# Patient Record
Sex: Female | Born: 2005 | Race: White | Hispanic: No | Marital: Single | Smoking: Never smoker
Health system: Southern US, Community
[De-identification: ages and names within clinical notes are randomized; demographics above are authoritative.]

## PROBLEM LIST (undated history)

## (undated) HISTORY — PX: FOOT FRACTURE SURGERY: SHX645

## (undated) HISTORY — PX: TONSILLECTOMY AND ADENOIDECTOMY: SUR1326

---
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
## 2024-06-18 ENCOUNTER — Ambulatory Visit: Payer: Self-pay

## 2024-06-19 ENCOUNTER — Ambulatory Visit: Admission: EM | Admit: 2024-06-19 | Discharge: 2024-06-19 | Disposition: A | Discharge status: Home / Self Care

## 2024-06-19 ENCOUNTER — Encounter: Payer: Self-pay | Admitting: Emergency Medicine

## 2024-06-19 DIAGNOSIS — R079 Chest pain, unspecified: Secondary | ICD-10-CM

## 2024-06-19 NOTE — ED Triage Notes (Signed)
 Patient complains right sided chest pain that started x 3 days ago. Patient reports that it hurts to take a deep breath. Patent denies SOB.  Patient has not taken anything for symptoms.

## 2024-06-19 NOTE — ED Provider Notes (Signed)
 [facility]    CSN: [id number] Arrival date & time: 06/19/24  1135      History   Chief Complaint Chief Complaint  Patient presents with   Chest Pain    HPI Ashley Rosales is a 18 y.o. female.   Patient presents for evaluation of intermittent right-sided chest pain beginning 3 days ago.  At times can be felt with movement, exacerbated by lying flat, can be felt with deep breathing.  Has not occurred before.  Denies cardiac history or familial history.  Has not attempted treatment.  Endorses to being over the weekend.  Denies shortness of breath, dizziness, lightheadedness, vision changes or nausea or vomiting.  History reviewed. No pertinent past medical history.  Patient Active Problem List   Diagnosis Date Noted   Closed fracture of phalanx of foot 07/29/2022   Sprain of left shoulder 08/11/2020   Abnormal thyroid function test 08/14/2012   Frequency of urination and polyuria 01/11/2012    Past Surgical History:  Procedure Laterality Date   FOOT FRACTURE SURGERY     TONSILLECTOMY AND ADENOIDECTOMY      OB History   No obstetric history on file.      Home Medications    Prior to Admission medications   Medication Sig Start Date End Date Taking? Authorizing Provider  amoxicillin (AMOXIL) 400 MG/5ML suspension TAKE 2 TEASPOONSFUL (10 MLS) BY MOUTH TWICE A DAY FOR 10 DAYS    [provider]  lisdexamfetamine (VYVANSE) 20 MG capsule Take 1 capsule by mouth daily.    [provider]  meloxicam (MOBIC) 7.5 MG tablet TAKE 1 TABLET BY MOUTH    [provider]  Methylphenidate HCl ER (QUILLIVANT XR) 25 MG/5ML SRER GIVE 6ML BY MOUTH EVERY DAY    [provider]  norgestimate-ethinyl estradiol (ORTHO-CYCLEN) 0.25-35 MG-MCG tablet Take 1 tablet by mouth daily. 07/06/22   [provider]    Family History History reviewed. No pertinent family history.  Social History Social History   Tobacco Use   Smoking  status: Never   Smokeless tobacco: Never  Vaping Use   Vaping status: Former  Substance Use Topics   Alcohol use: Yes   Drug use: Never     Allergies   Patient has no known allergies.   Review of Systems Review of Systems   Physical Exam Triage Vital Signs ED Triage Vitals [06/19/24 1144]  Encounter Vitals Group     BP 115/80     Girls Systolic BP Percentile      Girls Diastolic BP Percentile      Boys Systolic BP Percentile      Boys Diastolic BP Percentile      Pulse Rate (!) 120     Resp 18     Temp 98.5 F (36.9 C)     Temp Source Oral     SpO2 97 %     Weight      Height      Head Circumference      Peak Flow      Pain Score      Pain Loc      Pain Education      Exclude from Growth Chart    No data found.  Updated Vital Signs BP 115/80 (BP Location: Left Arm)   Pulse (!) 120   Temp 98.5 F (36.9 C) (Oral)   Resp 18   LMP 06/10/2024 (Approximate)   SpO2 97%   Visual Acuity Right Eye Distance:  Left Eye Distance:   Bilateral Distance:    Right Eye Near:   Left Eye Near:    Bilateral Near:     Physical Exam Constitutional:      Appearance: Normal appearance.  Eyes:     Extraocular Movements: Extraocular movements intact.  Cardiovascular:     Rate and Rhythm: Normal rate and regular rhythm.     Pulses: Normal pulses.     Heart sounds: Normal heart sounds.  Pulmonary:     Effort: Pulmonary effort is normal.     Breath sounds: Normal breath sounds.  Chest:     Comments: No tenderness noted on exam, no ecchymosis swelling or deformity, chest wall symmetrical Neurological:     Mental Status: She is alert and oriented to person, place, and time. Mental status is at baseline.      UC Treatments / Results  Labs (all labs ordered are listed, but only abnormal results are displayed) Labs Reviewed - No data to display  EKG   Radiology No results found.  Procedures Procedures (including critical care time)  Medications Ordered in  UC Medications - No data to display  Initial Impression / Assessment and Plan / UC Course  I have reviewed the triage vital signs and the nursing notes.  Pertinent labs & imaging results that were available during my care of the patient were reviewed by me and considered in my medical decision making (see chart for details).  Right-sided chest pain  Vital signs are stable, EKG shows normal sinus rhythm, heart rate on EKG 73, lungs are clear to auscultation S1-S2 heard, no tenderness produced with palpation, discussed all findings with patient, stable for outpatient management, low suspicion for cardiac involvement but advised to monitor closely, has not attempted any treatment, recommended over-the-counter analgesics and RICE, advised PCP follow-up and given strict ER precautions for any worsening or new symptoms Final Clinical Impressions(s) / UC Diagnoses   Final diagnoses:  Right-sided chest pain     Discharge Instructions      Today you were evaluated for your chest pain  Blood pressure and heart rate are within normal ranges  Lungs are clear when they listen to you and your heart is beating in a regular pace and rhythm  EKG which checks for the electrical rhythm of the heart is also normal  You do not have a prior cardiac history and I have a low suspicion that your heart is the cause of your symptoms today but would like you to monitor them closely  Pain is most likely muscular  Take ibuprofen 600 to 800 mg every 6-8 hours consistently for at least 3 days, may use Tylenol additionally if needed  May use heat over the affected area 10 to 15-minute intervals  May massage and stretch as tolerated  Please schedule follow-up appointment with your primary doctor  At any point if you pain becomes more severe or constant please go to the nearest emergency department at any point if you begin to experience dizziness lightheadedness, vision changes, nausea vomiting or shortness  of breath please go to the nearest emergency department   ED Prescriptions   None    PDMP not reviewed this encounter.   Teresa Shelba SAUNDERS, [place] 06/19/24 1242

## 2024-06-19 NOTE — Discharge Instructions (Signed)
 Today you were evaluated for your chest pain  Blood pressure and heart rate are within normal ranges  Lungs are clear when they listen to you and your heart is beating in a regular pace and rhythm  EKG which checks for the electrical rhythm of the heart is also normal  You do not have a prior cardiac history and I have a low suspicion that your heart is the cause of your symptoms today but would like you to monitor them closely  Pain is most likely muscular  Take ibuprofen 600 to 800 mg every 6-8 hours consistently for at least 3 days, may use Tylenol additionally if needed  May use heat over the affected area 10 to 15-minute intervals  May massage and stretch as tolerated  Please schedule follow-up appointment with your primary doctor  At any point if you pain becomes more severe or constant please go to the nearest emergency department at any point if you begin to experience dizziness lightheadedness, vision changes, nausea vomiting or shortness of breath please go to the nearest emergency department
